# Patient Record
Sex: Male | Born: 1970 | Race: Black or African American | Hispanic: No | Marital: Single | Smoking: Current every day smoker
Health system: Southern US, Community
[De-identification: ages and names within clinical notes are randomized; demographics above are authoritative.]

## PROBLEM LIST (undated history)

## (undated) HISTORY — PX: CERVICAL FUSION: SHX112

---

## 2019-08-04 ENCOUNTER — Encounter (HOSPITAL_BASED_OUTPATIENT_CLINIC_OR_DEPARTMENT_OTHER): Payer: Self-pay | Admitting: Emergency Medicine

## 2019-08-04 ENCOUNTER — Other Ambulatory Visit: Payer: Self-pay

## 2019-08-04 ENCOUNTER — Emergency Department (HOSPITAL_BASED_OUTPATIENT_CLINIC_OR_DEPARTMENT_OTHER): Payer: Self-pay

## 2019-08-04 ENCOUNTER — Emergency Department (HOSPITAL_BASED_OUTPATIENT_CLINIC_OR_DEPARTMENT_OTHER)
Admission: EM | Admit: 2019-08-04 | Discharge: 2019-08-04 | Disposition: A | Discharge status: Home / Self Care | Payer: Self-pay | Attending: Emergency Medicine | Admitting: Emergency Medicine

## 2019-08-04 DIAGNOSIS — Y9389 Activity, other specified: Secondary | ICD-10-CM | POA: Insufficient documentation

## 2019-08-04 DIAGNOSIS — F17210 Nicotine dependence, cigarettes, uncomplicated: Secondary | ICD-10-CM | POA: Insufficient documentation

## 2019-08-04 DIAGNOSIS — Y9289 Other specified places as the place of occurrence of the external cause: Secondary | ICD-10-CM | POA: Insufficient documentation

## 2019-08-04 DIAGNOSIS — X500XXA Overexertion from strenuous movement or load, initial encounter: Secondary | ICD-10-CM | POA: Insufficient documentation

## 2019-08-04 DIAGNOSIS — S46911A Strain of unspecified muscle, fascia and tendon at shoulder and upper arm level, right arm, initial encounter: Secondary | ICD-10-CM | POA: Insufficient documentation

## 2019-08-04 DIAGNOSIS — Y998 Other external cause status: Secondary | ICD-10-CM | POA: Insufficient documentation

## 2019-08-04 MED ORDER — ACETAMINOPHEN 500 MG PO TABS: 500.0000 mg | ORAL_TABLET | Freq: Four times a day (QID) | ORAL | 0 refills | Status: AC | PRN

## 2019-08-04 MED ORDER — MELOXICAM 7.5 MG PO TABS: 7.5000 mg | ORAL_TABLET | Freq: Every day | ORAL | 0 refills | Status: AC

## 2019-08-04 MED ORDER — DICLOFENAC SODIUM 1 % EX GEL: 2.0000 g | Freq: Four times a day (QID) | CUTANEOUS | 0 refills | Status: AC

## 2019-08-04 NOTE — ED Triage Notes (Signed)
Pt c/o right shoulder pain onset Thursday after picking up a bag of oysters. Pt reports having right elbow pain x 1 month

## 2019-08-04 NOTE — ED Notes (Signed)
Patient transported to X-ray 

## 2019-08-04 NOTE — Discharge Instructions (Signed)
Take Mobic once daily as prescribed for your pain.  You can alternate with Tylenol, but do not combine Mobic with ibuprofen or any other NSAID medication.  You can however use Voltaren on your shoulder 4 times daily as prescribed.  Use ice and heat alternating 20 minutes on, 20 minutes off.  Wear your sling for comfort, but try to range your shoulder a couple times a day to help prevent frozen shoulder.  Please follow-up with Dr. Raeford Razor for further evaluation and treatment.  Please return the emergency department if you develop any new or worsening symptoms.

## 2019-08-06 NOTE — ED Provider Notes (Signed)
[facility] EMERGENCY DEPARTMENT Provider Note   CSN: [id number] Arrival date & time: 08/04/19  1557     History   Chief Complaint Chief Complaint  Patient presents with   Shoulder Pain    HPI Caleb Black is a 48 y.o. male who is previously healthy who presents with a right arm pain after lifting a bag of oysters on Thanksgiving day.  He reports pain with moving his shoulder and elbow.  He reports little bit of tingling in his fingers intermittently.  He denies any chest pain or shortness of breath.     HPI  History reviewed. No pertinent past medical history.  There are no active problems to display for this patient.   Past Surgical History:  Procedure Laterality Date   CERVICAL FUSION          Home Medications    Prior to Admission medications   Medication Sig Start Date End Date Taking? Authorizing Provider  acetaminophen (TYLENOL) 500 MG tablet Take 1 tablet (500 mg total) by mouth every 6 (six) hours as needed. 08/04/19   Kimbrely Buckel, Bea Graff, PA-C  diclofenac Sodium (VOLTAREN) 1 % GEL Apply 2 g topically 4 (four) times daily. 08/04/19   Makinzee Durley, Bea Graff, PA-C  meloxicam (MOBIC) 7.5 MG tablet Take 1 tablet (7.5 mg total) by mouth daily. 08/04/19   Frederica Kuster, PA-C    Family History No family history on file.  Social History Social History   Tobacco Use   Smoking status: Current Every Day Smoker   Smokeless tobacco: Never Used  Substance Use Topics   Alcohol use: Not Currently    Frequency: Never   Drug use: Not Currently     Allergies   Patient has no known allergies.   Review of Systems Review of Systems  Constitutional: Negative for fever.  Musculoskeletal: Positive for arthralgias.  Neurological: Negative for numbness. Light-headedness: paresthesia only.     Physical Exam Updated Vital Signs BP 118/89 (BP Location: Right Arm)    Pulse (!) 58    Temp 98.9 F (37.2 C) (Oral)    Resp 20    Ht 6\' 1"  (1.854 m)    Wt  81.6 kg    SpO2 99%    BMI 23.75 kg/m   Physical Exam Vitals signs and nursing note reviewed.  Constitutional:      General: He is not in acute distress.    Appearance: He is well-developed. He is not diaphoretic.  HENT:     Head: Normocephalic and atraumatic.     Mouth/Throat:     Pharynx: No oropharyngeal exudate.  Eyes:     General: No scleral icterus.       Right eye: No discharge.        Left eye: No discharge.     Conjunctiva/sclera: Conjunctivae normal.     Pupils: Pupils are equal, round, and reactive to light.  Neck:     Musculoskeletal: Normal range of motion and neck supple.     Thyroid: No thyromegaly.  Cardiovascular:     Rate and Rhythm: Normal rate and regular rhythm.     Heart sounds: Normal heart sounds. No murmur. No friction rub. No gallop.   Pulmonary:     Effort: Pulmonary effort is normal. No respiratory distress.     Breath sounds: Normal breath sounds. No stridor. No wheezing or rales.  Musculoskeletal:     Comments: Tenderness over the right deltoid down to the elbow; no significant pinpoint bony tenderness  to the shoulder or elbow, but significant pain with range of motion; Range of motion limited in all directions; sensation intact; radial pulse intact  Lymphadenopathy:     Cervical: No cervical adenopathy.  Skin:    General: Skin is warm and dry.     Coloration: Skin is not pale.     Findings: No rash.  Neurological:     Mental Status: He is alert.     Coordination: Coordination normal.      ED Treatments / Results  Labs (all labs ordered are listed, but only abnormal results are displayed) Labs Reviewed - No data to display  EKG None  Radiology Dg Shoulder Right  Result Date: 08/04/2019 CLINICAL DATA:  Right shoulder and elbow pain. EXAM: RIGHT SHOULDER - 2+ VIEW COMPARISON:  None. FINDINGS: There is no evidence of fracture or dislocation. There is no evidence of arthropathy or other focal bone abnormality. Soft tissues are  unremarkable. IMPRESSION: Negative. Electronically Signed   By: Gerome Sam III M.D   On: 08/04/2019 18:58   Dg Elbow Complete Right  Result Date: 08/04/2019 CLINICAL DATA:  Pain. EXAM: RIGHT ELBOW - COMPLETE 3+ VIEW COMPARISON:  None. FINDINGS: There is no evidence of fracture, dislocation, or joint effusion. There is no evidence of arthropathy or other focal bone abnormality. Soft tissues are unremarkable. IMPRESSION: Negative. Electronically Signed   By: Gerome Sam III M.D   On: 08/04/2019 18:59    Procedures Procedures (including critical care time)  Medications Ordered in ED Medications - No data to display   Initial Impression / Assessment and Plan / ED Course  I have reviewed the triage vital signs and the nursing notes.  Pertinent labs & imaging results that were available during my care of the patient were reviewed by me and considered in my medical decision making (see chart for details).        Patient with suspected strain or sprain.  Right shoulder and elbow x-rays are negative.  Given sling for comfort, but advised range of motion exercises.  Mobic, Tylenol, and Voltaren discussed.  Ice and heat discussed.  Follow-up to sports medicine symptoms are not improving.  Return precautions discussed.  Patient understands and agrees with plan.  Patient vitals stable throughout ED course and discharged in satisfactory condition.  Final Clinical Impressions(s) / ED Diagnoses   Final diagnoses:  Strain of right shoulder, initial encounter    ED Discharge Orders         Ordered    meloxicam (MOBIC) 7.5 MG tablet  Daily     08/04/19 1939    acetaminophen (TYLENOL) 500 MG tablet  Every 6 hours PRN     08/04/19 1939    diclofenac Sodium (VOLTAREN) 1 % GEL  4 times daily     08/04/19 1939           Emi Holes, PA-C 08/06/19 1112    Sabas Sous, MD 08/06/19 1222

## 2020-05-02 LAB — CBC WITH AUTO DIFFERENTIAL
Absolute Baso #: 0 10*3/uL (ref 0.0–0.2)
Absolute Eos #: 0 10*3/uL (ref 0.0–0.5)
Absolute Lymph #: 1.6 10*3/uL (ref 1.0–3.2)
Absolute Mono #: 0.7 10*3/uL (ref 0.3–1.0)
Basophils %: 0.2 % (ref 0.0–2.0)
Eosinophils %: 0.3 % (ref 0.0–7.0)
Hematocrit: 42.4 % (ref 38.0–52.0)
Hemoglobin: 14.1 g/dL (ref 13.0–17.3)
Immature Grans (Abs): 0.03 10*3/uL (ref 0.00–0.06)
Immature Granulocytes: 0.3 % (ref 0.1–0.6)
Lymphocytes: 15.7 % (ref 15.0–45.0)
MCH: 32 pg (ref 27.0–34.5)
MCHC: 33.3 g/dL (ref 32.0–36.0)
MCV: 96.4 fL (ref 84.0–100.0)
MPV: 10.5 fL (ref 7.2–13.2)
Monocytes: 7.1 % (ref 4.0–12.0)
Neutrophils %: 76.4 % — ABNORMAL HIGH (ref 42.0–74.0)
Neutrophils Absolute: 7.8 10*3/uL — ABNORMAL HIGH (ref 1.6–7.3)
Platelets: 145 10*3/uL (ref 140–440)
RBC: 4.4 x10e6/mcL (ref 4.00–5.60)
RDW: 14.2 % (ref 11.0–16.0)
WBC: 10.2 10*3/uL (ref 3.8–10.6)

## 2020-05-02 LAB — BASIC METABOLIC PANEL
Anion Gap: 7 mmol/L (ref 2–17)
BUN: 15 mg/dL (ref 6–20)
CO2: 27 mmol/L (ref 22–29)
Calcium: 8.7 mg/dL (ref 8.6–10.0)
Chloride: 105 mmol/L (ref 98–107)
Creatinine: 0.9 mg/dL (ref 0.7–1.3)
GFR African American: 117 mL/min/{1.73_m2} (ref >=90)
GFR Non-African American: 101 mL/min/{1.73_m2} (ref >=90)
Glucose: 90 mg/dL (ref 70–99)
OSMOLALITY CALCULATED: 278 mOsm/kg (ref 270–287)
Potassium: 4 mmol/L (ref 3.5–5.3)
Sodium: 139 mmol/L (ref 135–145)

## 2020-05-02 NOTE — Discharge Summary (Signed)
 ED Clinical Summary                      [facility]  [address]  [place], [place], [address]  [phone]           PERSON INFORMATION  Name: Clifford Weaver, Clifford Weaver Age:  49 Years DOB: 01-04-1971   Sex: Male Language: English PCP: PCP,  NONE   Marital Status:  Single Phone: [phone] Med Service: [facility]   MRN:  [id number] Acct# [id number] Arrival: 05/02/2020 14:27:00   Visit Reason: Nausea; Weakness; NAUSEA Acuity: 3 LOS: 000 03:26   Address:      [address] [place] [address]  Diagnosis:      Diaphoresis; Nausea & vomiting  Printed Prescriptions:            Allergies      No Known Allergies      Medications Administered During Visit:        Patient Medication List:              ondansetron (Zofran ODT 4 mg oral tablet, disintegrating) 1 Tabs Oral (given by mouth) every 4 hours as needed nausea/vomiting for 3 Days. Refills: 0.       Major Tests and Procedures:  The following procedures and tests were performed during your ED visit.  COMMONPROCEDURES%>  COMMON PROCEDURESCOMMENTS%>          Laboratory Orders  Name Status Details   BMP Completed Blood, Stat, ST - Stat, 05/02/20 15:58:00 EDT, 05/02/20 15:58:00 EDT, Nurse collect, DOMINICK CHARLESTON M-MD, Print label Y/N   CBCDIFF Completed Blood, Stat, ST - Stat, 05/02/20 15:58:00 EDT, 05/02/20 15:58:00 EDT, Nurse collect, DOMINICK CHARLESTON M-MD, Print label Y/N               Radiology Orders  No radiology orders were placed.              Patient Care Orders  Name Status Details   Discharge Patient Ordered 05/02/20 17:46:00 EDT   ED Assessment Adult Completed 05/02/20 14:40:52 EDT, 05/02/20 14:40:52 EDT   ED Secondary Triage Completed 05/02/20 14:40:52 EDT, 05/02/20 14:40:52 EDT   ED Triage Adult Completed 05/02/20 14:27:53 EDT, 05/02/20 14:27:53 EDT           PROVIDER INFORMATION              Provider Role Assigned Sampson Jude, RN, Carliss CHARLESTON ED Nurse 05/02/2020 14:37:57    DOMINICK CHARLESTON  M-MD ED Provider 05/02/2020 14:59:14        Attending Physician:  DOMINICK CHARLESTON M-MD    Admit Doc  DOMINICK CHARLESTON M-MD   Consulting Doc       VITALS INFORMATION  Vital Sign Triage Latest   Temp Oral ORAL_1%>37.1 degC ORAL%>37.1 degC   Temp Temporal TEMPORAL_1%> TEMPORAL%>   Temp Intravascular INTRAVASCULAR_1%> INTRAVASCULAR%>   Temp Axillary AXILLARY_1%> AXILLARY%>   Temp Rectal RECTAL_1%> RECTAL%>   02 Sat 100 % 99 %   Respiratory Rate RATE_1%>18 br/min RATE%>16 br/min   Peripheral Pulse Rate PULSE RATE_1%>56 bpm PULSE RATE%>56 bpm   Apical Heart Rate HEART RATE_1%> HEART RATE%>   Blood Pressure BLOOD PRESSURE_1%>/ BLOOD PRESSURE_1%>76 mmHg BLOOD PRESSURE%>95 mmHg / BLOOD PRESSURE%>61 mmHg              Immunizations      No Immunizations Documented This Visit       DISCHARGE  INFORMATION   Discharge Disposition: H Outpt-Sent Home   Discharge Location:    Home   Discharge Date and Time:    05/02/2020 17:53:02   ED Checkout Date and Time:    05/02/2020 17:53:02     DEPART REASON INCOMPLETE INFORMATION               Depart Action Incomplete Reason   Interactive View/I&O Recently assessed               Problems      Active           No Chronic Problems              Smoking Status      No Smoking Status Documented         PATIENT EDUCATION INFORMATION  Instructions:       Nausea and Vomiting, Adult, Easy-to-Read     Follow up:                  With: Address: When:   Follow up with primary care provider  Within 3 to 5 days, only if needed   Comments:   Follow up with your MD if not improved.   Return to ED if symptoms worsen           ED PROVIDER DOCUMENTATION     Patient:   Clifford Weaver, Clifford Weaver             MRN: [id number]            FIN: [id number]               Age:   49 years     Sex:  Male     DOB:  06/18/1971   Associated Diagnoses:   Nausea & vomiting; Diaphoresis   Author:   DOMINICK CHARLESTON M-MD      Basic Information   Additional information: Chief Complaint from Nursing Triage Note   Chief Complaint  Chief Complaint: Pt  came to ED with CC of sudden onset of weakness.  He is fully alert, no distress noted.  Pt denies pain just states I feel rough. (05/02/20 14:38:00).      History of Present Illness   The patient presents with nausea and vomiting.  The onset was just prior to arrival.  The course/duration of symptoms is episodic: with a single episode.  The character of symptoms is clear.  The degree at present is none.  The exacerbating factor is none.  The relieving factor is none.  Risk factors consist of none.  Therapy today: none.  Associated symptoms: denies abdominal pain, denies fever, denies chills, denies chest pain and denies shortness of breath.  Pt felt hot, diaphoretic and vomited once while in his car PTA. Family called EMS, pt feels better now. Generalized weakness, denies F/C or any URI symptoms. Tested negative for COVID 5 days ago. Denies chest pain, dyspnea. No abdominal pain.        Review of Systems   Constitutional symptoms:  Negative except as documented in HPI.   Skin symptoms:  Negative except as documented in HPI.   ENMT symptoms:  Negative except as documented in HPI.   Respiratory symptoms:  Negative except as documented in HPI.   Cardiovascular symptoms:  Negative except as documented in HPI.   Gastrointestinal symptoms:  Negative except as documented in HPI.   Genitourinary symptoms:  Negative except as documented in HPI.   Musculoskeletal symptoms:  Negative except  as documented in HPI.   Neurologic symptoms:  Negative except as documented in HPI.      Health Status   Allergies:    Allergic Reactions (Selected)  No Known Allergies.      Past Medical/ Family/ Social History   Problem list:    Active Problems (1)  No Chronic Problems   , per nurse's notes.      Physical Examination               Vital Signs   Vital Signs   05/02/2020 14:43 EDT Systolic Blood Pressure 105 mmHg    Diastolic Blood Pressure 76 mmHg    Peripheral Pulse Rate 56 bpm  LOW    Heart Rate Monitored 55 bpm  LOW    Respiratory Rate  13 br/min    Mean Arterial Pressure, Cuff 85 mmHg    SpO2 100 %   05/02/2020 14:38 EDT Systolic Blood Pressure 105 mmHg    Diastolic Blood Pressure 76 mmHg    Temperature Oral 37.1 degC    Heart Rate Monitored 50 bpm  LOW    Respiratory Rate 18 br/min    SpO2 100 %   .   Measurements   05/02/2020 14:40 EDT Body Mass Index est meas 25.71 kg/m2   05/02/2020 14:40 EDT Body Mass Index Measured 25.71 kg/m2   05/02/2020 14:38 EDT Height/Length Measured 185 cm    Weight Dosing 88 kg   .   Basic Oxygen Information   05/02/2020 14:43 EDT SpO2 100 %   05/02/2020 14:38 EDT Oxygen Therapy Room air    SpO2 100 %   .   General:  Alert, no acute distress.    Skin:  Warm, dry.    Head:  Normocephalic, atraumatic.    Neck:  Supple.   Cardiovascular:  Regular rate and rhythm, No murmur, Normal peripheral perfusion.    Respiratory:  Lungs are clear to auscultation, respirations are non-labored, breath sounds are equal, Symmetrical chest wall expansion.    Gastrointestinal:  Soft, Nontender, Non distended, Normal bowel sounds, No organomegaly, Guarding: Negative, Rebound: Negative.    Back:  Nontender, Normal range of motion.    Musculoskeletal:  Normal ROM.   Neurological:  No focal neurological deficit observed, CN II-XII intact, normal sensory observed, normal motor observed, normal speech observed.    Psychiatric:  Cooperative, appropriate mood & affect.       Medical Decision Making   Differential Diagnosis:  Nausea, vomiting, abdominal pain, dehydration, electrolyte abnormality.    Electrocardiogram:  Emergency Provider interpretation performed by me, time 05/02/2020 14:42:00, rate 50, No ST-T changes, no ectopy, normal PR & QRS intervals, The Rhythm is sinus bradycardia.  , Previous EKG available None available.    Results review:  Lab results : Lab View   05/02/2020 16:41 EDT Estimated Creatinine Clearance 114.13 mL/min   05/02/2020 16:21 EDT WBC 10.2 x10e3/mcL    RBC 4.40 x10e6/mcL    Hgb 14.1 g/dL    HCT 57.5 %    MCV 03.5 fL    MCH  32.0 pg    MCHC 33.3 g/dL    RDW 85.7 %    Platelet 145 x10e3/mcL    MPV 10.5 fL    Neutro Auto 76.4 %  HI    Neutro Absolute 7.8 x10e3/mcL  HI    Immature Grans Percent 0.3 %    Immature Grans Absolute 0.03 x10e3/mcL    Lymph Auto 15.7 %    Lymph Absolute 1.6 x10e3/mcL  Mono Auto 7.1 %    Mono Absolute 0.7 x10e3/mcL    Eosinophil Percent 0.3 %    Eos Absolute 0.0 x10e3/mcL    Basophil Auto 0.2 %    Baso Absolute 0.0 x10e3/mcL    Sodium Lvl 139 mmol/L    Potassium Lvl 4.0 mmol/L    Chloride 105 mmol/L    CO2 27 mmol/L    Glucose Random 90 mg/dL    BUN 15 mg/dL    Creatinine Lvl 0.9 mg/dL    AGAP 7 mmol/L    Osmolality Calc 278 mOsm/kg    Calcium Lvl 8.7 mg/dL    eGFR AA 882 fO/fpw/8.26f    eGFR Non-AA 101 mL/min/1.68m   .      Reexamination/ Reevaluation   Time: 05/02/2020 17:44:00 .   Vital signs   Basic Oxygen Information   05/02/2020 14:43 EDT SpO2 100 %   05/02/2020 14:38 EDT Oxygen Therapy Room air    SpO2 100 %      Course: improving, feels better. Nausea resolved.      Impression and Plan   Diagnosis   Nausea & vomiting (ICD10-CM R11.2, Discharge, Medical)   Diaphoresis (ICD10-CM R61, Discharge, Medical)   Plan   Condition: Improved.    Disposition: Discharged: Time  05/02/2020 17:45:00, to home.    Prescriptions: Launch prescriptions   Pharmacy:  Zofran ODT 4 mg oral tablet, disintegrating (Prescribe): 4 mg, 1 tabs, Oral, q4hr, for 3 days, PRN: nausea/vomiting, 12 tabs, 0 Refill(s).    Patient was given the following educational materials: Nausea and Vomiting, Adult, Easy-to-Read.    Follow up with: Follow up with primary care provider Within 3 to 5 days, only if needed Follow up with your MD if not improved.  Return to ED if symptoms worsen.    Counseled: Patient, Regarding diagnosis, Regarding diagnostic results, Regarding treatment plan, Regarding prescription, Patient indicated understanding of instructions.

## 2020-05-02 NOTE — ED Notes (Signed)
ED Patient Education Note     Patient Education Materials Follows:  Easy-to-Read     Nausea and Vomiting    Nausea means you feel sick to your stomach. Throwing up (vomiting) is a reflex where stomach contents come out of your mouth.      HOME CARE     Take medicine as told by your doctor.     Do not force yourself to eat. However, you do need to drink fluids.     If you feel like eating, eat a normal diet as told by your doctor.    ? Eat rice, wheat, potatoes, bread, lean meats, yogurt, fruits, and vegetables.    ? Avoid high-fat foods.     Drink enough fluids to keep your pee (urine) clear or pale yellow.     Ask your doctor how to replace body fluid losses (rehydrate). Signs of body fluid loss (dehydration) include:    ? Feeling very thirsty.    ? Dry lips and mouth.    ? Feeling dizzy.     ? Dark pee.    ? Peeing less than normal.    ? Feeling confused.    ? Fast breathing or heart rate.    GET HELP RIGHT AWAY IF:     You have blood in your throw up.     You have black or bloody poop (stool).     You have a bad headache or stiff neck.     You feel confused.     You have bad belly (abdominal) pain.     You have chest pain or trouble breathing.     You do not pee at least once every 8 hours.     You have cold, clammy skin.     You keep throwing up after 24 to 48 hours.     You have a fever.    MAKE SURE YOU:     Understand these instructions.     Will watch your condition.     Will get help right away if you are not doing well or get worse.    This information is not intended to replace advice given to you by your health care provider. Make sure you discuss any questions you have with your health care provider.    Document Released: 02/08/2008 Document Revised: 11/14/2011 Document Reviewed: 04/28/2015  Elsevier Interactive Patient Education ?2016 Elsevier Inc.

## 2020-05-02 NOTE — ED Notes (Signed)
 ED Patient Summary       ;       [facility]  [address], [place] [address]  [phone]  Discharge Instructions (Patient)  Name: Clifford Weaver, Clifford Weaver  DOB:  1970/11/24                   MRN: [id number]                   FIN: [id number]  Reason For Visit: Nausea; Weakness; NAUSEA  Final Diagnosis: Diaphoresis; Nausea & vomiting     Visit Date: 05/02/2020 14:27:00  Address: [address] [place] [address]  Phone: [phone]     Emergency Department Providers:         Primary Physician:      DOMINICK LAMAR HERO      [facility] would like to thank you for allowing us  to assist you with your healthcare needs. The following includes patient education materials and information regarding your injury/illness.     Follow-up Instructions:  You were seen today on an emergency basis. Please contact your primary care doctor for a follow up appointment. If you received a referral to a specialist doctor, it is important you follow-up as instructed.    It is important that you call your follow-up doctor to schedule and confirm the location of your next appointment. Your doctor may practice at multiple locations. The office location of your follow-up appointment may be different to the one written on your discharge instructions.    If you do not have a primary care doctor, please call [phone] for help in finding a [facility] Provider. For help in finding a specialist doctor, please call [phone].    If your condition gets worse before your follow-up with your primary care doctor or specialist, please return to the Emergency Department.      Coronavirus 2019 (COVID-19) Reminders:     Patients age 8 - 60, with parental consent, and patients over age 42 can make an appointment for a COVID-19 vaccine. Patients can contact their [facility] doctors' offices to schedule an appointment to receive the COVID-19  vaccine. Patients who do not have a [facility] physician can call [phone] to schedule vaccination appointments.      Follow Up Appointments:  Primary Care Provider:     Name: PCP,  NONE     Phone:                   With: Address: When:   Follow up with primary care provider  Within 3 to 5 days, only if needed   Comments:   Follow up with your MD if not improved.   Return to ED if symptoms worsen                   New Medications  Printed Prescriptions  ondansetron (Zofran ODT 4 mg oral tablet, disintegrating) 1 Tabs Oral (given by mouth) every 4 hours as needed nausea/vomiting for 3 Days. Refills: 0.  Last Dose:____________________      Allergy Info: No Known Allergies     Discharge Additional Information          Discharge Patient 05/02/20 17:46:00 EDT      Patient Education Materials:        Nausea and Vomiting    Nausea means you feel sick to your stomach.  Throwing up (vomiting) is a reflex where stomach contents come out of your mouth.      HOME CARE     Take medicine as told by your doctor.     Do not force yourself to eat. However, you do need to drink fluids.     If you feel like eating, eat a normal diet as told by your doctor.    ? Eat rice, wheat, potatoes, bread, lean meats, yogurt, fruits, and vegetables.    ? Avoid high-fat foods.     Drink enough fluids to keep your pee (urine) clear or pale yellow.     Ask your doctor how to replace body fluid losses (rehydrate). Signs of body fluid loss (dehydration) include:    ? Feeling very thirsty.    ? Dry lips and mouth.    ? Feeling dizzy.     ? Dark pee.    ? Peeing less than normal.    ? Feeling confused.    ? Fast breathing or heart rate.    GET HELP RIGHT AWAY IF:     You have blood in your throw up.     You have black or bloody poop (stool).     You have a bad headache or stiff neck.     You feel confused.     You have bad belly (abdominal) pain.     You have chest pain or trouble breathing.     You do not pee at least once every 8 hours.      You have cold, clammy skin.     You keep throwing up after 24 to 48 hours.     You have a fever.    MAKE SURE YOU:     Understand these instructions.     Will watch your condition.     Will get help right away if you are not doing well or get worse.    This information is not intended to replace advice given to you by your health care provider. Make sure you discuss any questions you have with your health care provider.    Document Released: 02/08/2008 Document Revised: 11/14/2011 Document Reviewed: 04/28/2015  Elsevier Interactive Patient Education ?2016 Elsevier Inc.      ---------------------------------------------------------------------------------------------------------------------  [facility]) encourages you to self-enroll in the [facility] Patient Portal.  [facility] Patient Portal will allow you to manage your personal health information securely from your own electronic device now and in the future.  To begin your Patient Portal enrollment process, please visit [email or web address] Click on "Sign up now" under [facility].  If you find that you need additional assistance on the [facility] Patient Portal or need a copy of your medical records, please call the [facility] Medical Records Office at [phone].  Comment:

## 2020-05-02 NOTE — ED Notes (Signed)
ED Pre-Arrival Note        Pre-Arrival Summary    Name:  M5,    Current Date:  05/02/2020 14:28:08 EDT  Gender:  Male  Date of Birth:    Age:  49  Pre-Arrival Type:  EMS  ETA:  05/02/2020 14:43:00 EDT  Primary Care Physician:    Presenting Problem:  N/V  Pre-Arrival User:  Dalene Carrow  Referring Source:    Location:  [facility]  BP:  90/60  HR:  54  Blood Sugar:  138  IV:  Yes            PreArrival Communication Form  Emergency Department        Additional Patient Information:        Orders:  [    ] CBC                                            [     ] CT Head no contrast  [    ] BMP                                           [     ] CT Abdomen/Pelvis no contrast  [    ] PT/INR                                       [     ] CT Abdomen/Pelvis IV contrast, w/ oral contrast  [    ] Troponin                                   [     ] CT Abdomen/Pelvis IV contrast, no oral contrast  [    ] BNP                                            [     ] See ordersheet  [    ] CXR                                             [     ] Other:__________________________  [    ] EKG

## 2020-05-02 NOTE — ED Notes (Signed)
 ED Triage Note       ED Triage Adult Entered On:  05/02/2020 14:40 EDT    Performed On:  05/02/2020 14:38 EDT by Inga, RN, Carliss Charleston               Triage   Numeric Rating Pain Scale :   0 = No pain   Chief Complaint :   Pt came to ED with CC of sudden onset of weakness.  He is fully alert, no distress noted.  Pt denies pain just states I feel rough.     [place] Mode of Arrival :   Ambulance   Infectious Disease Documentation :   Document assessment   Temperature Oral :   37.1 degC(Converted to: 98.8 degF)    Heart Rate Monitored :   50 bpm (LOW)    Respiratory Rate :   18 br/min   Systolic Blood Pressure :   105 mmHg   Diastolic Blood Pressure :   76 mmHg   SpO2 :   100 %   Oxygen Therapy :   Room air   Patient presentation :   None of the above   Chief Complaint or Presentation suggest infection :   No   Dosing Weight Obtained By :   Measured   Weight Dosing :   88 kg(Converted to: 194 lb 0 oz)    Height :   185 cm(Converted to: 6 ft 1 in)    Body Mass Index Dosing :   26 kg/m2   Brummett, RN, Carliss Charleston - 05/02/2020 14:38 EDT   DCP GENERIC CODE   Tracking Acuity :   3   Tracking Group :   ED [employer]   Brummett, RN, Carliss Charleston - 05/02/2020 14:38 EDT   ED General Section :   Document assessment   Pregnancy Status :   N/A   ED Allergies Section :   Document assessment   ED Reason for Visit Section :   Document assessment   ED Quick Assessment :   Patient appears awake, alert, oriented to baseline. Skin warm and dry. Moves all extremities. Respiration even and unlabored. Appears in no apparent distress.   Brummett, RN, Carliss Charleston - 05/02/2020 14:38 EDT   PTA/Triage Treatments   ED PTA Pre-Arrival Service :   [facility]   ED PTA Medic Procedures :   IV initiated   IV Bolus Fluid Duration (minutes) :   1,000 mL   Brummett, RN, Carliss Charleston - 05/02/2020 14:38 EDT   ID Risk Screen Symptoms   Recent Travel History :   No recent travel   Close Contact with COVID-19 ID :   No   Last 14  days COVID-19 ID :   No   TB Symptom Screen :   No symptoms   C. diff Symptom/History ID :   Neither of the above   Brummett, RN, Carliss Charleston - 05/02/2020 14:38 EDT   Allergies   (As Of: 05/02/2020 14:40:51 EDT)   Allergies (Active)   No Known Allergies  Estimated Onset Date:   Unspecified ; Created By:   Inga, RN, Carliss Charleston; Reaction Status:   Active ; Category:   Drug ; Substance:   No Known Allergies ; Type:   Allergy ; Updated By:   Inga RN, Carliss Charleston; Reviewed Date:   05/02/2020 14:39 EDT        Psycho-Social   Last 3 mo, thoughts killing self/others :  Patient denies   Right click within box for Suspected Abuse policy link. :   None   Feels Safe Where Live :   Yes   Brummett, RN, Carliss Charleston - 05/02/2020 14:38 EDT   ED Reason for Visit   (As Of: 05/02/2020 14:40:51 EDT)   Problems(Active)    No Chronic Problems (Cerner  :NKP )  Name of Problem:   No Chronic Problems ; Recorder:   Brummett, RN, Carliss Charleston; Code:   NKP ; Last Updated:   05/02/2020 14:40 EDT ; Life Cycle Date:   05/02/2020 ; Life Cycle Status:   Active ; Vocabulary:   Cerner          Diagnoses(Active)    Weakness  Date:   05/02/2020 ; Diagnosis Type:   Reason For Visit ; Confirmation:   Complaint of ; Clinical Dx:   Weakness ; Classification:   Medical ; Clinical Service:   Emergency medicine ; Code:   PNED ; Probability:   0 ; Diagnosis Code:   [id number]

## 2020-05-02 NOTE — ED Notes (Signed)
ED Triage Note       ED Secondary Triage Entered On:  05/02/2020 14:43 EDT    Performed On:  05/02/2020 14:40 EDT by Sande Rives, RN, Oleh Genin               General Information   Barriers to Learning :   None evident   Languages :   English   COVID-19 Vaccine Status :   Not received   ED Home Meds Section :   Document assessment   [facility] ED Fall Risk Section :   Document assessment   ED Advance Directives Section :   Document assessment   ED Palliative Screen :   N/A (prefilled for <65yo)   Brummett, RN, Oleh Genin - 05/02/2020 14:40 EDT   (As Of: 05/02/2020 14:43:36 EDT)   Problems(Active)    No Chronic Problems (Cerner  :NKP )  Name of Problem:   No Chronic Problems ; Recorder:   Brummett, RN, Oleh Genin; Code:   NKP ; Last Updated:   05/02/2020 14:40 EDT ; Life Cycle Date:   05/02/2020 ; Life Cycle Status:   Active ; Vocabulary:   Cerner          Diagnoses(Active)    Weakness  Date:   05/02/2020 ; Diagnosis Type:   Reason For Visit ; Confirmation:   Complaint of ; Clinical Dx:   Weakness ; Classification:   Medical ; Clinical Service:   Emergency medicine ; Code:   PNED ; Probability:   0 ; Diagnosis Code:   [id number]             -    Procedure History   (As Of: 05/02/2020 14:43:36 EDT)     [facility] Fall Risk Assessment Tool   Hx of falling last 3 months ED Fall :   No   Patient confused or disoriented ED Fall :   No   Patient intoxicated or sedated ED Fall :   No   Patient impaired gait ED Fall :   No   Use a mobility assistance device ED Fall :   No   Patient altered elimination ED Fall :   No   [facility] ED Fall Score :   0    Brummett, RN, Oleh Genin - 05/02/2020 14:40 EDT   ED Advance Directive   Advance Directive :   No   Brummett, RN, Oleh Genin - 05/02/2020 14:40 EDT   Med Hx   Medication List   (As Of: 05/02/2020 14:43:37 EDT)

## 2020-05-02 NOTE — ED Provider Notes (Signed)
Nausea        Patient:   Clifford Weaver, Clifford Weaver             MRN: [id number]            FIN: [id number]               Age:   49 years     Sex:  Male     DOB:  1971-01-06   Associated Diagnoses:   Nausea & vomiting; Diaphoresis   Author:   Lewie Chamber M-MD      Basic Information   Additional information: Chief Complaint from Nursing Triage Note   Chief Complaint  Chief Complaint: Pt came to ED with CC of sudden onset of weakness.  He is fully alert, no distress noted.  Pt denies pain just states "I feel rough". (05/02/20 14:38:00).      History of Present Illness   The patient presents with nausea and vomiting.  The onset was just prior to arrival.  The course/duration of symptoms is episodic: with a single episode.  The character of symptoms is clear.  The degree at present is none.  The exacerbating factor is none.  The relieving factor is none.  Risk factors consist of none.  Therapy today: none.  Associated symptoms: denies abdominal pain, denies fever, denies chills, denies chest pain and denies shortness of breath.  Pt felt hot, diaphoretic and vomited once while in his car PTA. Family called EMS, pt feels better now. Generalized weakness, denies F/C or any URI symptoms. Tested negative for COVID 5 days ago. Denies chest pain, dyspnea. No abdominal pain.        Review of Systems   Constitutional symptoms:  Negative except as documented in HPI.   Skin symptoms:  Negative except as documented in HPI.   ENMT symptoms:  Negative except as documented in HPI.   Respiratory symptoms:  Negative except as documented in HPI.   Cardiovascular symptoms:  Negative except as documented in HPI.   Gastrointestinal symptoms:  Negative except as documented in HPI.   Genitourinary symptoms:  Negative except as documented in HPI.   Musculoskeletal symptoms:  Negative except as documented in HPI.   Neurologic symptoms:  Negative except as documented in HPI.      Health Status   Allergies:    Allergic Reactions (Selected)  No Known  Allergies.      Past Medical/ Family/ Social History   Problem list:    Active Problems (1)  No Chronic Problems   , per nurse's notes.      Physical Examination               Vital Signs   Vital Signs   05/03/5620 30:86 EDT Systolic Blood Pressure 578 mmHg    Diastolic Blood Pressure 76 mmHg    Peripheral Pulse Rate 56 bpm  LOW    Heart Rate Monitored 55 bpm  LOW    Respiratory Rate 13 br/min    Mean Arterial Pressure, Cuff 85 mmHg    SpO2 100 %   4/69/6295 28:41 EDT Systolic Blood Pressure 324 mmHg    Diastolic Blood Pressure 76 mmHg    Temperature Oral 37.1 degC    Heart Rate Monitored 50 bpm  LOW    Respiratory Rate 18 br/min    SpO2 100 %   .   Measurements   05/02/2020 14:40 EDT Body Mass Index est meas 25.71 kg/m2   05/02/2020 14:40 EDT Body Mass Index  Measured 25.71 kg/m2   05/02/2020 14:38 EDT Height/Length Measured 185 cm    Weight Dosing 88 kg   .   Basic Oxygen Information   05/02/2020 14:43 EDT SpO2 100 %   05/02/2020 14:38 EDT Oxygen Therapy Room air    SpO2 100 %   .   General:  Alert, no acute distress.    Skin:  Warm, dry.    Head:  Normocephalic, atraumatic.    Neck:  Supple.   Cardiovascular:  Regular rate and rhythm, No murmur, Normal peripheral perfusion.    Respiratory:  Lungs are clear to auscultation, respirations are non-labored, breath sounds are equal, Symmetrical chest wall expansion.    Gastrointestinal:  Soft, Nontender, Non distended, Normal bowel sounds, No organomegaly, Guarding: Negative, Rebound: Negative.    Back:  Nontender, Normal range of motion.    Musculoskeletal:  Normal ROM.   Neurological:  No focal neurological deficit observed, CN II-XII intact, normal sensory observed, normal motor observed, normal speech observed.    Psychiatric:  Cooperative, appropriate mood & affect.       Medical Decision Making   Differential Diagnosis:  Nausea, vomiting, abdominal pain, dehydration, electrolyte abnormality.    Electrocardiogram:  Emergency Provider interpretation performed by me, time  05/02/2020 14:42:00, rate 50, No ST-T changes, no ectopy, normal PR & QRS intervals, The Rhythm is sinus bradycardia.  , Previous EKG available None available.    Results review:  Lab results : Lab View   05/02/2020 16:41 EDT Estimated Creatinine Clearance 114.13 mL/min   05/02/2020 16:21 EDT WBC 10.2 x10e3/mcL    RBC 4.40 x10e6/mcL    Hgb 14.1 g/dL    HCT 42.4 %    MCV 96.4 fL    MCH 32.0 pg    MCHC 33.3 g/dL    RDW 14.2 %    Platelet 145 x10e3/mcL    MPV 10.5 fL    Neutro Auto 76.4 %  HI    Neutro Absolute 7.8 x10e3/mcL  HI    Immature Grans Percent 0.3 %    Immature Grans Absolute 0.03 x10e3/mcL    Lymph Auto 15.7 %    Lymph Absolute 1.6 x10e3/mcL    Mono Auto 7.1 %    Mono Absolute 0.7 x10e3/mcL    Eosinophil Percent 0.3 %    Eos Absolute 0.0 x10e3/mcL    Basophil Auto 0.2 %    Baso Absolute 0.0 x10e3/mcL    Sodium Lvl 139 mmol/L    Potassium Lvl 4.0 mmol/L    Chloride 105 mmol/L    CO2 27 mmol/L    Glucose Random 90 mg/dL    BUN 15 mg/dL    Creatinine Lvl 0.9 mg/dL    AGAP 7 mmol/L    Osmolality Calc 278 mOsm/kg    Calcium Lvl 8.7 mg/dL    eGFR AA 117 mL/min/1.26m???    eGFR Non-AA 101 mL/min/1.761m??   .      Reexamination/ Reevaluation   Time: 05/02/2020 17:44:00 .   Vital signs   Basic Oxygen Information   05/02/2020 14:43 EDT SpO2 100 %   05/02/2020 14:38 EDT Oxygen Therapy Room air    SpO2 100 %      Course: improving, feels better. Nausea resolved.      Impression and Plan   Diagnosis   Nausea & vomiting (ICD10-CM R11.2, Discharge, Medical)   Diaphoresis (ICD10-CM R61, Discharge, Medical)   Plan   Condition: Improved.    Disposition: Discharged: Time  05/02/2020 17:45:00, to home.  Prescriptions: Launch prescriptions   Pharmacy:  Zofran ODT 4 mg oral tablet, disintegrating (Prescribe): 4 mg, 1 tabs, Oral, q4hr, for 3 days, PRN: nausea/vomiting, 12 tabs, 0 Refill(s).    Patient was given the following educational materials: Nausea and Vomiting, Adult, Easy-to-Read.    Follow up with: Follow up with primary care  provider Within 3 to 5 days, only if needed Follow up with your MD if not improved.  Return to ED if symptoms worsen.    Counseled: Patient, Regarding diagnosis, Regarding diagnostic results, Regarding treatment plan, Regarding prescription, Patient indicated understanding of instructions.    Signature Line     Electronically Signed on 05/02/2020 05:46 PM EDT   ________________________________________________   Lewie Chamber M-MD               Modified by: Lewie Chamber M-MD on 05/02/2020 05:26 PM EDT      Modified by: Lewie Chamber M-MD on 05/02/2020 05:46 PM EDT

## 2021-02-22 IMAGING — CR DG ELBOW COMPLETE 3+V*R*
4 series · 4 of 4 positions shown · non-contrast
Comparison: None.

CLINICAL DATA: Pain.

EXAM:
RIGHT ELBOW - COMPLETE 3+ VIEW

[x elbow joint ap right]
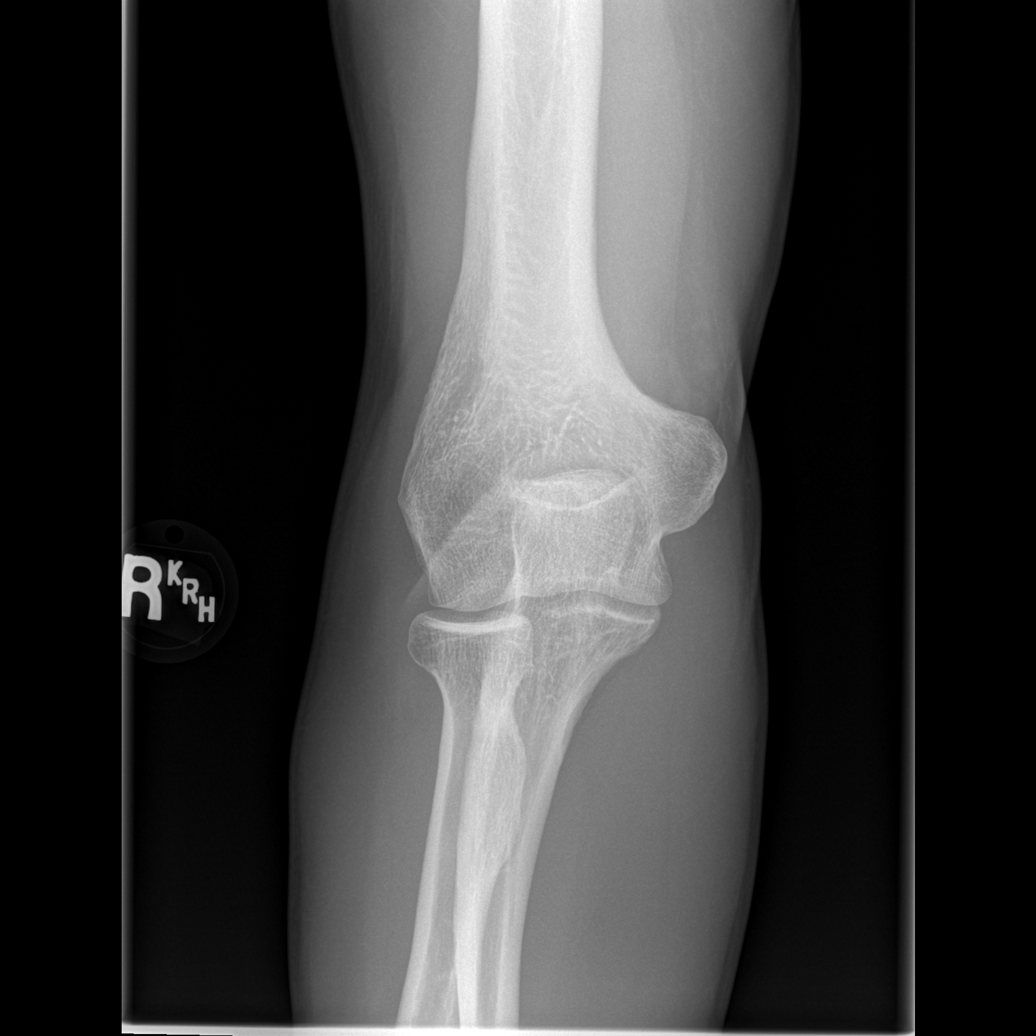

[x elbow joint obl. right (1 of 2)]
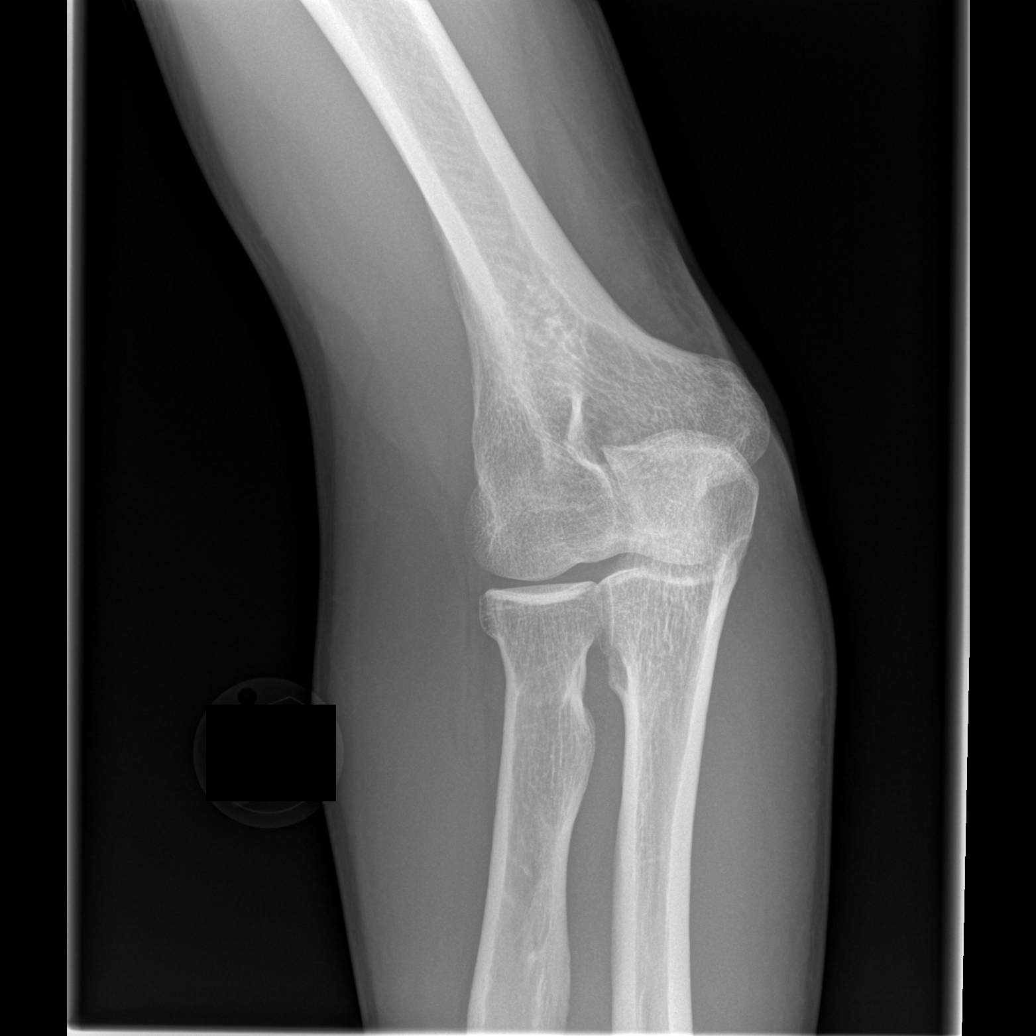

[x elbow joint obl. right (2 of 2)]
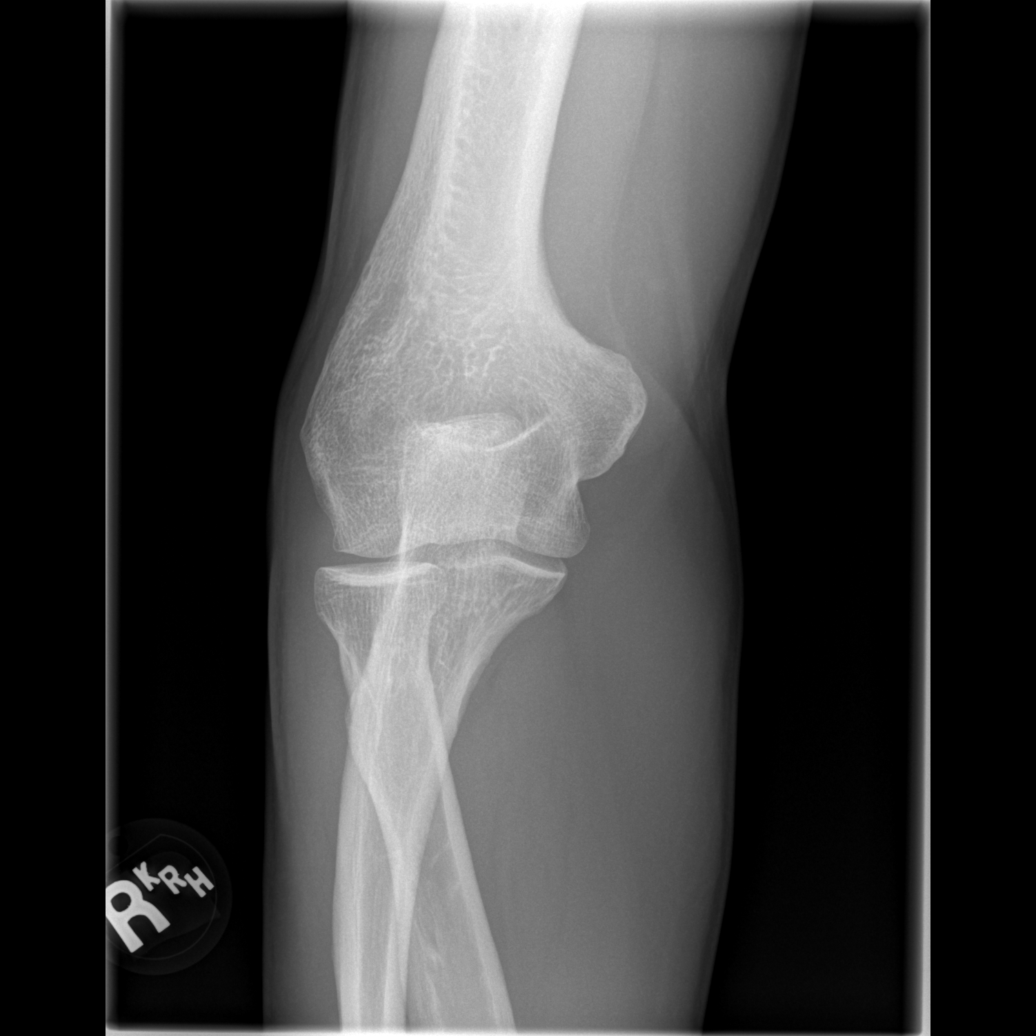

[x elbow joint lat right]
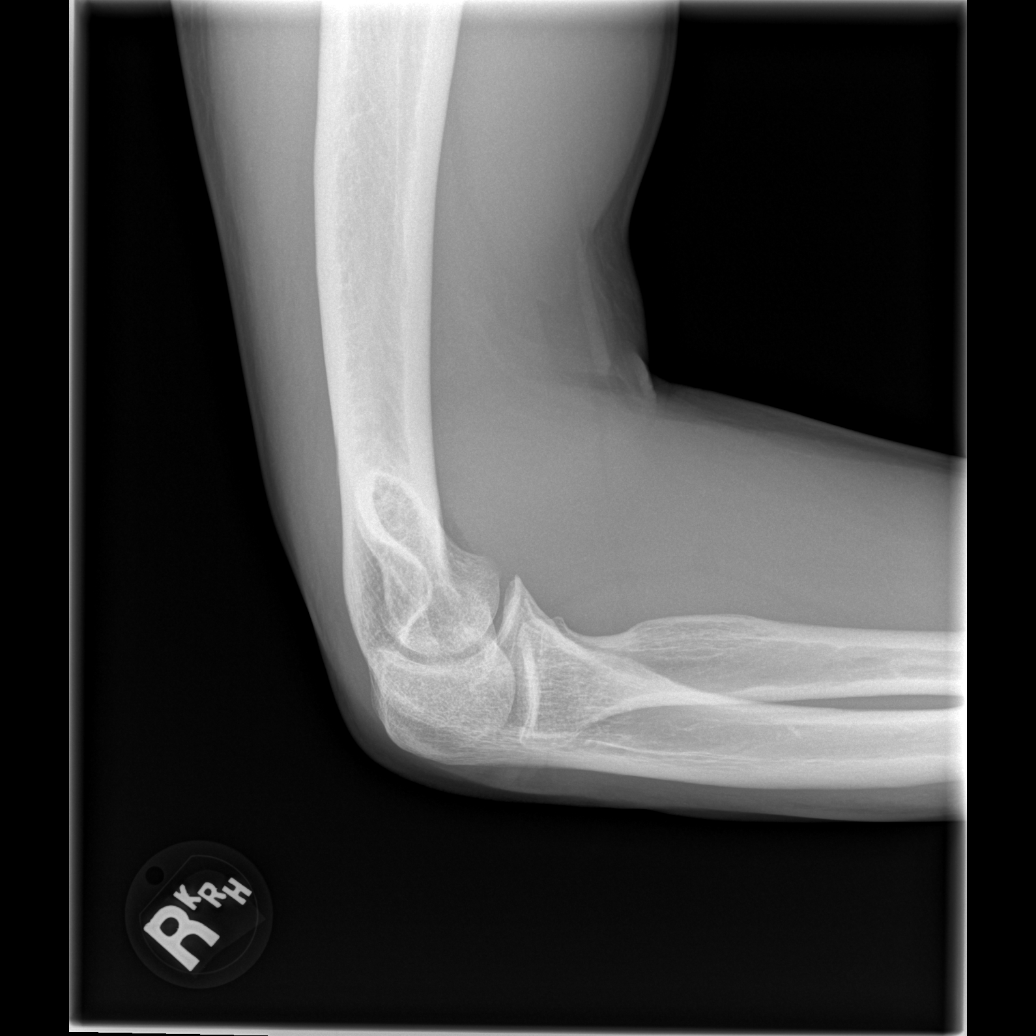

[4 of 4 positions shown; findings below may reference images not displayed]

FINDINGS: There is no evidence of fracture, dislocation, or joint effusion.
There is no evidence of arthropathy or other focal bone abnormality.
Soft tissues are unremarkable.
IMPRESSION: Negative.

## 2024-01-18 ENCOUNTER — Emergency Department: Admit: 2024-01-18

## 2024-01-18 ENCOUNTER — Inpatient Hospital Stay
Admit: 2024-01-18 | Discharge: 2024-01-18 | Disposition: A | Discharge status: Home / Self Care | Attending: Emergency Medicine

## 2024-01-18 DIAGNOSIS — K649 Unspecified hemorrhoids: Secondary | ICD-10-CM

## 2024-01-18 DIAGNOSIS — K648 Other hemorrhoids: Secondary | ICD-10-CM

## 2024-01-18 LAB — COMPREHENSIVE METABOLIC PANEL
ALT: 10 U/L (ref 0–42)
AST: 24 U/L (ref 0–46)
Albumin/Globulin Ratio: 1.37 (ref 1.00–2.70)
Albumin: 4.1 g/dL (ref 3.5–5.2)
Alk Phosphatase: 69 U/L (ref 40–130)
Anion Gap: 11 mmol/L (ref 2–17)
BUN: 13 mg/dL (ref 6–20)
CO2: 27 mmol/L (ref 22–29)
Calcium: 9.1 mg/dL (ref 8.5–10.7)
Chloride: 98 mmol/L (ref 98–107)
Creatinine: 1 mg/dL (ref 0.7–1.3)
Est, Glom Filt Rate: 92 mL/min/1.73mÂ² (ref >=60)
Globulin: 3 g/dL (ref 1.9–4.4)
Glucose: 89 mg/dL (ref 70–99)
Osmolaliy Calculated: 271 mosm/kg (ref 270–287)
Potassium: 4 mmol/L (ref 3.5–5.3)
Sodium: 136 mmol/L (ref 135–145)
Total Bilirubin: 0.39 mg/dL (ref 0.00–1.20)
Total Protein: 7.1 g/dL (ref 5.7–8.3)

## 2024-01-18 LAB — CBC WITH AUTO DIFFERENTIAL
Basophils %: 0.6 % (ref 0.0–2.0)
Basophils Absolute: 0.1 10*3/uL (ref 0.0–0.2)
Eosinophils %: 2.8 % (ref 0.0–7.0)
Eosinophils Absolute: 0.2 10*3/uL (ref 0.0–0.5)
Hematocrit: 44.5 % (ref 38.0–52.0)
Hemoglobin: 14.6 g/dL (ref 13.0–17.3)
Immature Grans (Abs): 0.01 10*3/uL (ref 0.00–0.06)
Immature Granulocytes %: 0.1 % (ref 0.0–0.6)
Lymphocytes Absolute: 2.4 10*3/uL (ref 1.0–3.2)
Lymphocytes: 28.5 % (ref 15.0–45.0)
MCH: 32.4 pg (ref 27.0–34.5)
MCHC: 32.8 g/dL (ref 30.0–36.0)
MCV: 98.9 fL (ref 84.0–100.0)
MPV: 10 fL (ref 7.0–12.2)
Monocytes %: 10 % (ref 4.0–12.0)
Monocytes Absolute: 0.9 10*3/uL (ref 0.3–1.0)
Neutrophils %: 58 % (ref 42.0–74.0)
Neutrophils Absolute: 5 10*3/uL (ref 1.6–7.3)
Platelets: 192 10*3/uL (ref 140–440)
RBC: 4.5 x10e6/mcL (ref 4.00–5.60)
RDW: 14.2 % (ref 10.0–17.0)
WBC: 8.6 10*3/uL (ref 3.8–10.6)

## 2024-01-18 MED ORDER — HYDROCODONE-ACETAMINOPHEN 5-325 MG PO TABS
5-325 | ORAL_TABLET | ORAL | 0 refills | Status: AC | PRN
Start: 2024-01-18 — End: 2024-01-21

## 2024-01-18 MED ORDER — SODIUM CHLORIDE 0.9 % IV BOLUS
0.9 | Freq: Once | INTRAVENOUS | Status: AC
Start: 2024-01-18 — End: 2024-01-18
  Administered 2024-01-18: 22:00:00 1000 mL via INTRAVENOUS

## 2024-01-18 MED ORDER — DOCUSATE SODIUM 100 MG PO CAPS
100 | ORAL_CAPSULE | Freq: Two times a day (BID) | ORAL | 0 refills | 30.00000 days | Status: AC
Start: 2024-01-18 — End: ?

## 2024-01-18 MED ORDER — PROCTOFOAM HC 1-1 % EX FOAM
1-1 | CUTANEOUS | 0 refills | 6.50000 days | Status: AC
Start: 2024-01-18 — End: ?

## 2024-01-18 MED ORDER — IOPAMIDOL 61 % IV SOLN
61 | Freq: Once | INTRAVENOUS | Status: AC | PRN
Start: 2024-01-18 — End: 2024-01-18
  Administered 2024-01-18: 22:00:00 100 mL via INTRAVENOUS

## 2024-01-18 MED ORDER — POLYETHYLENE GLYCOL 3350 17 G PO PACK
17 | PACK | Freq: Every day | ORAL | 0 refills | 30.00000 days | Status: AC
Start: 2024-01-18 — End: 2024-02-17

## 2024-01-18 NOTE — Discharge Instructions (Addendum)
 Your hemoglobin was stable today. Try the foam over the suppositories and take the stool softeners so that you are not straining to stool. Call the colorectal surgeons ASAP for close follow up and further treatment. Please also call the PCP you were referred to. Return sooner for fever, vomiting, worsening pain, or any other new or concerning symptoms!    Sit in epsom salt and warm water baths a few times per day for the next few days

## 2024-01-18 NOTE — ED Provider Notes (Signed)
 [facility] EMERGENCY DEPT  EMERGENCY DEPARTMENT ENCOUNTER      Pt Name: Clifford Weaver  MRN: [id number]  Birthdate [birth year]  Date of evaluation: 01/18/2024  Provider: Larsen Zettel, DO    CHIEF COMPLAINT       Chief Complaint   Patient presents with    Hemorrhoids     Pt c/o bleeding from external hemorrhoid. Sts was seen previously at [facility] for same and was prescribed toradol, prochlorperazine, and anucort but hasn't started meds yet. Last BM 3 days ago.           HISTORY OF PRESENT ILLNESS Note limiting factors.   Patient reports that he has had headache and rectal bleeding for a few days. He went to [employer] two days ago. Had hemoglobin 15 and he reports "that she tried to push it in and ever since then it was bleeding." He reports pain to the area. He was prescribed anusol supp but didn't take them because his daughter told him it would make him bleed more. He is not on any blood thinner.     The history is provided by the patient. No language interpreter was used.       Nursing Notes were reviewed.  REVIEW OF SYSTEMS       Review of Systems   Gastrointestinal:  Positive for blood in stool, constipation and rectal pain. Negative for abdominal pain, diarrhea and vomiting.     Except as noted above the remainder of the review of systems was reviewed and negative.   PAST MEDICAL HISTORY     Past Medical History:   Diagnosis Date    Migraines      SURGICAL HISTORY       Past Surgical History:   Procedure Laterality Date    CERVICAL FUSION N/A      CURRENT MEDICATIONS       Previous Medications    No medications on file     ALLERGIES     Patient has no known allergies.  FAMILY HISTORY     No family history on file.     SOCIAL HISTORY       Social History     Socioeconomic History    Marital status: Single     Spouse name: None    Number of children: None    Years of education: None    Highest education level: None   Tobacco Use    Smoking status: Every Day     Current packs/day: 0.10     Average packs/day: 0.1  packs/day for 7.0 years (0.7 ttl pk-yrs)     Types: Cigarettes     SCREENINGS       Glasgow Coma Scale  Eye Opening: Spontaneous  Best Verbal Response: Oriented  Best Motor Response: Obeys commands  Glasgow Coma Scale Score: 15             CIWA Assessment  BP: 117/73  Pulse: 53           PHYSICAL EXAM       ED Triage Vitals [01/18/24 1711]   BP Systolic BP Percentile Diastolic BP Percentile Temp Temp Source Pulse Respirations SpO2   115/83 -- -- 97.9 F (36.6 C) Oral 61 19 100 %      Height Weight - Scale         1.854 m (6\' 1" ) 78.5 kg (173 lb)             Physical Exam  Exam conducted  with a chaperone present.   Constitutional:       Appearance: Normal appearance.   HENT:      Head: Normocephalic and atraumatic.      Nose: Nose normal.      Mouth/Throat:      Mouth: Mucous membranes are moist.   Eyes:      Extraocular Movements: Extraocular movements intact.      Pupils: Pupils are equal, round, and reactive to light.   Cardiovascular:      Rate and Rhythm: Normal rate and regular rhythm.      Pulses: Normal pulses.      Heart sounds: Normal heart sounds.   Pulmonary:      Effort: Pulmonary effort is normal.      Breath sounds: Normal breath sounds.   Abdominal:      General: Abdomen is flat.      Palpations: Abdomen is soft.   Genitourinary:     Rectum: Tenderness and internal hemorrhoid present.      Comments: Internal hemorroid thrombosed, bleeding  Musculoskeletal:         General: Normal range of motion.      Cervical back: Normal range of motion.   Skin:     General: Skin is warm.      Capillary Refill: Capillary refill takes less than 2 seconds.   Neurological:      General: No focal deficit present.      Mental Status: He is alert and oriented to person, place, and time.   Psychiatric:         Mood and Affect: Mood normal.         Behavior: Behavior normal.         DIAGNOSTIC RESULTS     RADIOLOGY:   Non-plain film images such as CT, Ultrasound and MRI are read by the radiologist. Plain radiographic  images are visualized and preliminarily interpreted by the emergency physician with the below findings:    Interpretation per the Radiologist below, if available at the time of this note:    CT ABDOMEN PELVIS W IV CONTRAST Additional Contrast? None   Final Result   1.  No evidence of acute abdominopelvic pathology.   2.  Peripherally enhancing lesion in the left hepatic lobe (3.6 x 3.5 cm) with    an additional smaller heterogeneously enhancing right hepatic lobe lesion. 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 Hyperenhancing lesions in the inferior right    hepatic lobe are most likely flash filling hemangiomas or sites of perfusional    abnormality. Recommend comparison with prior studies. If the patient has a    history of or risk factors for chronic liver disease or history of malignancy,    these can be further characterized with outpatient abdominal MRI with and    without contrast.           LABS:  Labs Reviewed   CBC WITH AUTO DIFFERENTIAL   COMPREHENSIVE METABOLIC PANEL       All other labs were within normal range or not returned as of this dictation.  EMERGENCY DEPARTMENT COURSE and DIFFERENTIAL DIAGNOSIS/MDM:   Vitals:    Vitals:    01/18/24 1711 01/18/24 1724 01/18/24 1800   BP: 115/83 99/70 117/73   Pulse: 61 53 53   Resp: 19 18 18    Temp: 97.9 F (36.6 C) 98.1 F (36.7 C)    TempSrc: Oral Oral    SpO2: 100% 100% 100%   Weight: 78.5  kg (173 lb) 77.1 kg (170 lb)    Height: 1.854 m (6\' 1" ) 1.854 m (6\' 1" )        Medical Decision Making  Patient had not tried the supp. He has no acute findings on the CT and his hemoglobin is stable. Discussed with both his daughters and one did ask that he be admitted. I explained that he doesn't meet criteria at this time but I can treat his pain, change to proctofoam  if he doesn't like the supp and add pain control, stool softeners. Referral to Colorectal for definitive treatment and also referral to PCP. He and daughters amenable to this plan      Amount and/or  Complexity of Data Reviewed  External Data Reviewed: labs and notes.  Labs: ordered. Decision-making details documented in ED Course.  Radiology: ordered and independent interpretation performed. Decision-making details documented in ED Course.    Risk  OTC drugs.  Prescription drug management.           REASSESSMENT          PROCEDURES:  Unless otherwise noted below, none   Procedures      FINAL IMPRESSION      1. Acute hemorrhoid          DISPOSITION/PLAN   DISPOSITION Decision To Discharge 01/18/2024 06:46:20 PM      PATIENT REFERRED TO:  [facility]  [address]  [address]  [place]  [address]  [phone]  Call today      Doneen Fuelling, MD  [address]  [address]  [place] [address]  [phone]    Call in 1 day        DISCHARGE MEDICATIONS:  New Prescriptions    DOCUSATE SODIUM  (COLACE) 100 MG CAPSULE    Take 1 capsule by mouth 2 times daily    HYDROCODONE -ACETAMINOPHEN  (NORCO) 5-325 MG PER TABLET    Take 1 tablet by mouth every 4 hours as needed for Pain for up to 3 days. Intended supply: 3 days. Take lowest dose possible to manage pain Max Daily Amount: 6 tablets    HYDROCORT-PRAMOXINE, PERIANAL, (PROCTOFOAM  HC) 1-1 % RECTAL FOAM    Apply to hemorrhoid TID for 7 days    POLYETHYLENE GLYCOL (MIRALAX ) 17 G PACKET    Take 1 packet by mouth daily     (Please note that portions of this note were completed with a voice recognition program.  Efforts were made to edit the dictations but occasionally words are mis-transcribed.)    Naiah Donahoe, DO (electronically signed)  Attending Emergency Physician            Keneth Pay, DO  01/18/24 [phone]

## 2024-01-22 ENCOUNTER — Emergency Department: Admit: 2024-01-22

## 2024-01-22 ENCOUNTER — Inpatient Hospital Stay
Admit: 2024-01-22 | Discharge: 2024-01-22 | Disposition: A | Discharge status: Home / Self Care | Attending: Emergency Medicine

## 2024-01-22 DIAGNOSIS — K625 Hemorrhage of anus and rectum: Secondary | ICD-10-CM

## 2024-01-22 LAB — CBC WITH AUTO DIFFERENTIAL
Basophils %: 0.6 % (ref 0.0–2.0)
Basophils Absolute: 0.1 10*3/uL (ref 0.0–0.2)
Eosinophils %: 2.5 % (ref 0.0–7.0)
Eosinophils Absolute: 0.2 10*3/uL (ref 0.0–0.5)
Hematocrit: 42.1 % (ref 38.0–52.0)
Hemoglobin: 13.8 g/dL (ref 13.0–17.3)
Immature Grans (Abs): 0 10*3/uL (ref 0.00–0.06)
Immature Granulocytes %: 0 % (ref 0.0–0.6)
Lymphocytes Absolute: 2.4 10*3/uL (ref 1.0–3.2)
Lymphocytes: 28 % (ref 15.0–45.0)
MCH: 32.5 pg (ref 27.0–34.5)
MCHC: 32.8 g/dL (ref 30.0–36.0)
MCV: 99.3 fL (ref 84.0–100.0)
MPV: 10.3 fL (ref 7.0–12.2)
Monocytes %: 8.9 % (ref 4.0–12.0)
Monocytes Absolute: 0.8 10*3/uL (ref 0.3–1.0)
Neutrophils %: 60 % (ref 42.0–74.0)
Neutrophils Absolute: 5.1 10*3/uL (ref 1.6–7.3)
Platelets: 159 10*3/uL (ref 140–440)
RBC: 4.24 x10e6/mcL (ref 4.00–5.60)
RDW: 14.4 % (ref 10.0–17.0)
WBC: 8.6 10*3/uL (ref 3.8–10.6)

## 2024-01-22 LAB — BASIC METABOLIC PANEL
Anion Gap: 10 mmol/L (ref 2–17)
BUN: 13 mg/dL (ref 6–20)
CO2: 28 mmol/L (ref 22–29)
Calcium: 9 mg/dL (ref 8.5–10.7)
Chloride: 104 mmol/L (ref 98–107)
Creatinine: 1 mg/dL (ref 0.7–1.3)
Est, Glom Filt Rate: 92 mL/min/1.73mÂ² (ref >=60)
Glucose: 82 mg/dL (ref 70–99)
Osmolaliy Calculated: 282 mosm/kg (ref 270–287)
Potassium: 4.3 mmol/L (ref 3.5–5.3)
Sodium: 142 mmol/L (ref 135–145)

## 2024-01-22 LAB — PROTIME-INR
INR: 1.1 — ABNORMAL LOW (ref 1.5–3.5)
Protime: 14.2 s (ref 11.6–14.5)

## 2024-01-22 MED ORDER — IOPAMIDOL 61 % IV SOLN
61 | Freq: Once | INTRAVENOUS | Status: AC | PRN
Start: 2024-01-22 — End: 2024-01-22
  Administered 2024-01-22: 19:00:00 100 mL via INTRAVENOUS

## 2024-01-22 MED ORDER — PEG 3350-KCL-NA BICARB-NACL 420 G PO SOLR
420 | Freq: Once | ORAL | 0 refills | Status: AC
Start: 2024-01-22 — End: 2024-01-22

## 2024-01-22 NOTE — ED Provider Notes (Addendum)
 [facility] EMERGENCY DEPARTMENT  EMERGENCY DEPARTMENT ENCOUNTER      Pt Name: Clifford Weaver  MRN: [id number]  Birthdate April 18, 1971  Date of evaluation: 01/22/2024  Eval Time: Provider evaluation time: 01/22/24 1438  Provider: Lattie Poli, MD    CHIEF COMPLAINT       Chief Complaint   Patient presents with    Rectal Bleeding     Pt biba from home with c/o dark red rectal bleeding that started Thursday. Pt was seen at [facility] Thursday for same and diagnosed with a hemorrhoid. States last BM was one week ago, reports otc meds not helping. Denies blood thinners. Reports lower abd pain, denies n/v.        HISTORY OF PRESENT ILLNESS    This is a 53 year old male who presents with rectal bleeding and rectal pain for over a week he was seen at [facility] originally diagnosed with a hemorrhoid but he never uses Anusol cream or suppositories and had a normal hemoglobin he came here 5 days ago and saw Dr. Solomon Dupre who worked him up as well for the same complaint scanned his abdomen and did not find any anemia or significant findings on his CAT scan advised him to move the use of the medication he was prescribed which he says he has been doing but he still has not had a good bowel movement in about a week and when he does pass gas its bright red blood.  There is blood on his rectum but there is no obvious external hemorrhoid that I see.  He is having a lot of belly pain as well and feel obligated to scan him a second time to see if anything is changed.  He does not look particularly anemic however he was referred to colorectal surgery but has not seen them.  There was some discussion about admission last time but he did not meet any criteria not sure he is gena meet any criteria at this time but will see what his workup looks like          Nursing Notes were reviewed.    REVIEW OF SYSTEMS     Review of Systems   Gastrointestinal:  Positive for blood in stool and rectal pain.   All other systems  reviewed and are negative.      Except as noted above the remainder of the review of systems was reviewed and negative.     PAST MEDICAL HISTORY     Past Medical History:   Diagnosis Date    Migraines        SURGICAL HISTORY       Past Surgical History:   Procedure Laterality Date    CERVICAL FUSION N/A        CURRENT MEDICATIONS       Previous Medications    DOCUSATE SODIUM (COLACE) 100 MG CAPSULE    Take 1 capsule by mouth 2 times daily    HYDROCORT-PRAMOXINE, PERIANAL, (PROCTOFOAM HC) 1-1 % RECTAL FOAM    Apply to hemorrhoid TID for 7 days    POLYETHYLENE GLYCOL (MIRALAX) 17 G PACKET    Take 1 packet by mouth daily       ALLERGIES     Patient has no known allergies.    FAMILY HISTORY     History reviewed. No pertinent family history.     SOCIAL HISTORY       Social History     Socioeconomic History    Marital status:  Single     Spouse name: None    Number of children: None    Years of education: None    Highest education level: None   Tobacco Use    Smoking status: Every Day     Current packs/day: 0.10     Average packs/day: 0.1 packs/day for 7.0 years (0.7 ttl pk-yrs)     Types: Cigarettes       SCREENINGS         Glasgow Coma Scale  Eye Opening: Spontaneous  Best Verbal Response: Oriented  Best Motor Response: Obeys commands  Glasgow Coma Scale Score: 15                     CIWA Assessment  BP: 135/84  Pulse: 50                 PHYSICAL EXAM    (up to 7 for level 4, 8 or more for level 5)     ED Triage Vitals   Encounter Vitals Group      BP 01/22/24 1441 135/84      Systolic BP Percentile --       Diastolic BP Percentile --       Pulse 01/22/24 1441 50      Respirations 01/22/24 1441 18      Temp 01/22/24 1446 98.3 F (36.8 C)      Temp Source 01/22/24 1441 Oral      SpO2 01/22/24 1441 99 %      Weight - Scale 01/22/24 1441 78.8 kg (173 lb 11.6 oz)      Height 01/22/24 1441 1.854 m (6\' 1" )      Head Circumference --       Peak Flow --       Pain Score --       Pain Loc --       Pain Education --       Exclude  from Growth Chart --         Physical Exam  Vitals and nursing note reviewed.   Constitutional:       Appearance: Normal appearance.   HENT:      Head: Normocephalic and atraumatic.      Right Ear: External ear normal.      Left Ear: External ear normal.      Nose: Nose normal.      Mouth/Throat:      Mouth: Mucous membranes are moist.   Eyes:      Pupils: Pupils are equal, round, and reactive to light.   Cardiovascular:      Rate and Rhythm: Normal rate.   Pulmonary:      Effort: Pulmonary effort is normal.   Abdominal:      General: Abdomen is flat.   Genitourinary:     Comments: poistive for gross blood  Musculoskeletal:         General: Normal range of motion.      Cervical back: Normal range of motion.   Skin:     General: Skin is warm.   Neurological:      General: No focal deficit present.      Mental Status: He is alert.   Psychiatric:         Mood and Affect: Mood normal.         Procedures    DIAGNOSTIC RESULTS     EKG: All EKG's are interpreted by the Emergency Department Physician who either signs  or Co-signs this chart in the absence of a cardiologist.      RADIOLOGY:   Non-plain film images such as CT, Ultrasound and MRI are read by the radiologist. Plain radiographic images are visualized and preliminarily interpreted by the emergency physician with the below findings:    Interpretation per the Radiologist below, if available at the time of this note:    CT ABDOMEN PELVIS W IV CONTRAST Additional Contrast? None   Final Result   1.  No definite acute abdominopelvic pathology.   2.  Mild mesenteric stranding in the left hemiabdomen. This can be seen as a    reactive finding with nonspecific enterocolitis although there is no distinctly    inflamed small or large bowel identified. Moderate stool burden.   3.  Redemonstrated enhancing hepatic lesions which have a variable appearance    compared to the 01/18/2024 study secondary to slight differences in contrast    bolus timing. 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 Compare to more remote    prior studies. If the patient has a history of or risk factors for chronic liver    disease or history of malignancy, these can be further characterized with    routine follow-up abdominal MRI with and without contrast.          ED BEDSIDE ULTRASOUND:   Performed by ED Physician - none    LABS:  Labs Reviewed   PROTIME-INR - Abnormal; Notable for the following components:       Result Value    INR 1.1 (*)     All other components within normal limits   BASIC METABOLIC PANEL   CBC WITH AUTO DIFFERENTIAL       All other labs were within normal range or not returned as of this dictation.    EMERGENCY DEPARTMENT COURSE/REASSESSMENT and MDM:   Vitals:    Vitals:    01/22/24 1441 01/22/24 1446   BP: 135/84    Pulse: 50    Resp: 18    Temp:  98.3 F (36.8 C)   TempSrc: Oral Oral   SpO2: 99%    Weight: 78.8 kg (173 lb 11.6 oz)    Height: 1.854 m (6\' 1" )        ED Course:       MDM     Amount and/or Complexity of Data Reviewed  Clinical lab tests: reviewed  Tests in the radiology section of CPT: reviewed    Patient's family is pretty upset that he is not admittable I explained that because his hemoglobin is normal and he is young and relatively healthy that he needs to go home did not have a bowel movement he has a moderate stool burden that will not be fixed by an enema advised that he needs to take GoLytely at home use sitz bath and Anusol and follow-up with colorectal as an outpatient return for increasing pain bleeding or other issues if this does not work then maybe we can get him admitted.      CONSULTS:  None      FINAL IMPRESSION      1. Rectal bleeding    2. Constipation, unspecified constipation type          DISPOSITION/PLAN   DISPOSITION Decision To Discharge 01/22/2024 04:12:08 PM      PATIENT REFERRED TO:  Whitney Hams, APRN - CNP  [address]  [address]  [place] [address]  [phone]  DISCHARGE MEDICATIONS:  New Prescriptions    POLYETHYLENE  GLYCOL-ELECTROLYTES (NULYTELY) 420 G SOLUTION    Take 4,000 mLs by mouth once for 1 dose Drink 10 to 20 cc every 10 minutes until a good bowel movement happens.     Controlled Substances Monitoring:          No data to display                (Please note that portions of this note were completed with a voice recognition program.  Efforts were made to edit the dictations but occasionally words are mis-transcribed.)    Lattie Poli, MD (electronically signed)  Attending Emergency Physician           Bentleigh Stankus, Ltanya Rummer, MD  01/22/24 1614       Jaculin Rasmus, Ltanya Rummer, MD  01/22/24 1615

## 2024-01-22 NOTE — ED Notes (Signed)
 11
